# Patient Record
Sex: Female | Born: 1965 | Race: Black or African American | Hispanic: No | Marital: Single | Smoking: Never smoker
Health system: Southern US, Community
[De-identification: ages and names within clinical notes are randomized; demographics above are authoritative.]

## PROBLEM LIST (undated history)

## (undated) DIAGNOSIS — T7840XA Allergy, unspecified, initial encounter: Secondary | ICD-10-CM

## (undated) HISTORY — DX: Allergy, unspecified, initial encounter: T78.40XA

---
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
## 2005-07-15 ENCOUNTER — Other Ambulatory Visit: Admission: RE | Admit: 2005-07-15 | Discharge: 2005-07-15 | Payer: Self-pay | Admitting: Obstetrics and Gynecology

## 2007-10-22 ENCOUNTER — Encounter: Admission: RE | Admit: 2007-10-22 | Discharge: 2007-10-22 | Payer: Self-pay | Admitting: Obstetrics and Gynecology

## 2008-04-19 ENCOUNTER — Encounter: Admission: RE | Admit: 2008-04-19 | Discharge: 2008-04-19 | Payer: Self-pay | Admitting: Obstetrics and Gynecology

## 2008-10-21 ENCOUNTER — Encounter: Admission: RE | Admit: 2008-10-21 | Discharge: 2008-10-21 | Payer: Self-pay | Admitting: Obstetrics and Gynecology

## 2009-07-18 ENCOUNTER — Encounter: Admission: RE | Admit: 2009-07-18 | Discharge: 2009-07-18 | Payer: Self-pay | Admitting: Obstetrics and Gynecology

## 2009-09-07 IMAGING — MG MM DIGITAL DIAGNOSTIC UNILAT*R*
2 series · 2 of 2 positions shown · non-contrast
Comparison: 10-16-07.

DG DIAGNOSTIC UNILATERAL R
CC and MLO view(s) were taken of the right breast.

DIGITAL UNILATERAL RIGHT  DIAGNOSTIC MAMMOGRAM WITH CAD:
CLINICAL DATA: Calcifications seen 10-16-07.

[R CC]
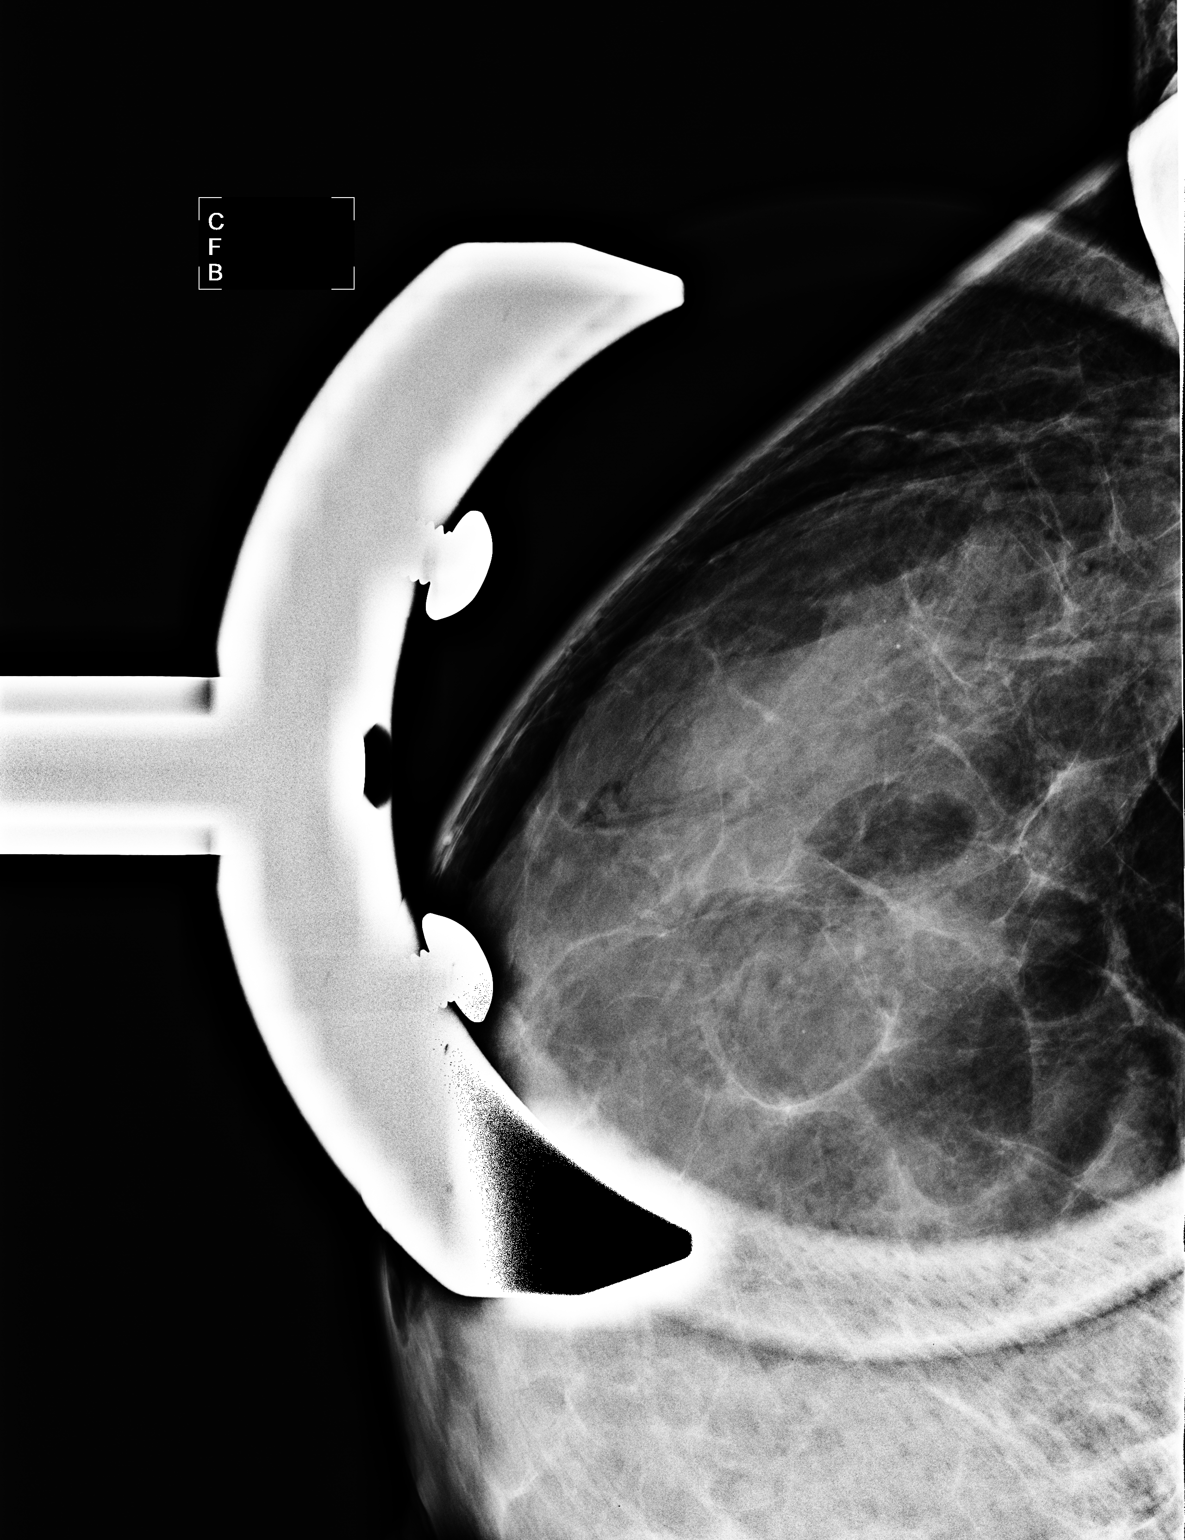

[R ML]
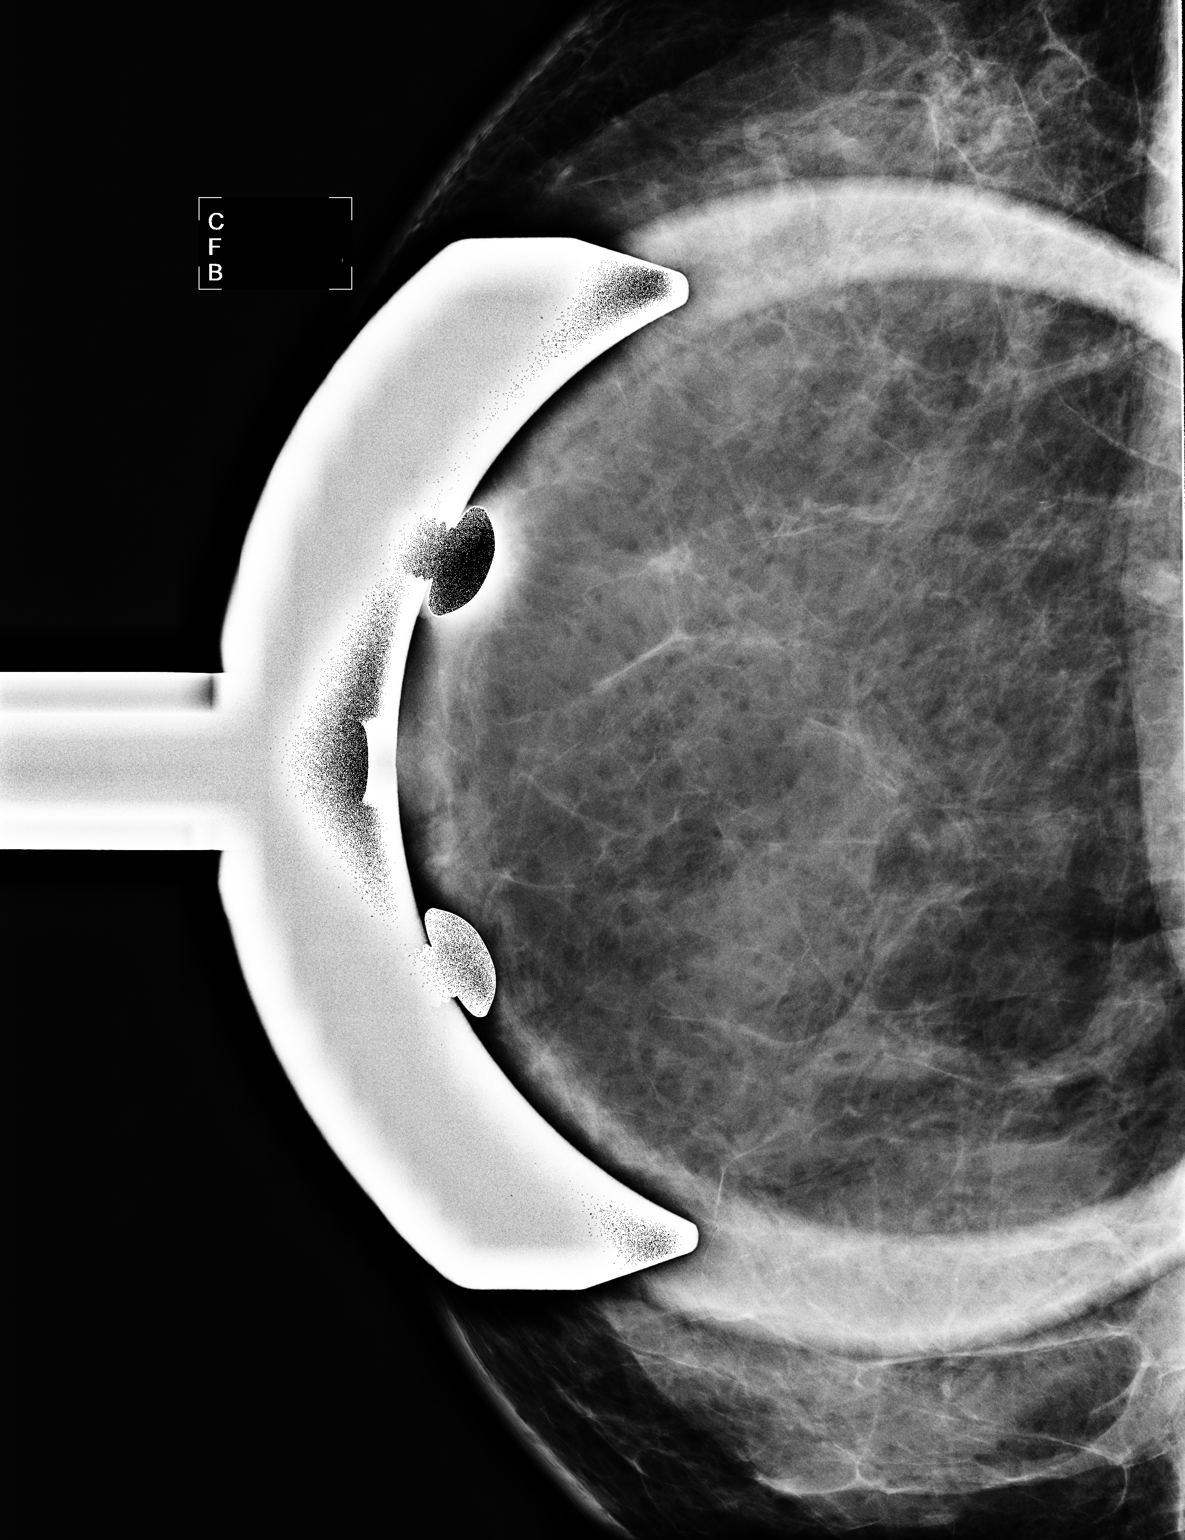

[2 of 2 positions shown; findings below may reference images not displayed]

Magnification views right breast were performed.  There are three or four round calcifications 
right breast posteriorly 9 o'clock position.  On the magnification lateral view there is suggestion
that these adopt a linear configuration suggesting milk of calcium.
IMPRESSION: Probably benign calcifications.  Recommend diagnostic mammogram right breast in six months.  It is 
perceived that some of these calcifications were likely previously present.

ASSESSMENT: Probably benign - BI-RADS 3

Diagnostic mammogram of the right breast in 6 months.
ANALYZED BY COMPUTER AIDED DETECTION. , THIS PROCEDURE WAS A DIGITAL MAMMOGRAM.

## 2016-02-15 ENCOUNTER — Other Ambulatory Visit: Payer: Self-pay | Admitting: Obstetrics and Gynecology

## 2016-02-15 DIAGNOSIS — Z803 Family history of malignant neoplasm of breast: Secondary | ICD-10-CM

## 2016-02-23 ENCOUNTER — Ambulatory Visit
Admission: RE | Admit: 2016-02-23 | Discharge: 2016-02-23 | Disposition: A | Discharge status: Home / Self Care | Payer: BC Managed Care – PPO | Source: Ambulatory Visit | Attending: Obstetrics and Gynecology | Admitting: Obstetrics and Gynecology

## 2016-02-23 DIAGNOSIS — Z803 Family history of malignant neoplasm of breast: Secondary | ICD-10-CM

## 2016-02-23 MED ORDER — GADOBENATE DIMEGLUMINE 529 MG/ML IV SOLN
12.0000 mL | Freq: Once | INTRAVENOUS | Status: AC | PRN
  Administered 2016-02-23: 12 mL via INTRAVENOUS

## 2016-02-27 ENCOUNTER — Other Ambulatory Visit: Payer: Self-pay | Admitting: Obstetrics and Gynecology

## 2016-02-27 ENCOUNTER — Other Ambulatory Visit: Payer: Self-pay

## 2016-02-27 DIAGNOSIS — R928 Other abnormal and inconclusive findings on diagnostic imaging of breast: Secondary | ICD-10-CM

## 2016-03-04 ENCOUNTER — Ambulatory Visit
Admission: RE | Admit: 2016-03-04 | Discharge: 2016-03-04 | Disposition: A | Discharge status: Home / Self Care | Payer: BC Managed Care – PPO | Source: Ambulatory Visit | Attending: Obstetrics and Gynecology | Admitting: Obstetrics and Gynecology

## 2016-03-04 ENCOUNTER — Other Ambulatory Visit: Payer: Self-pay | Admitting: Obstetrics and Gynecology

## 2016-03-04 DIAGNOSIS — R928 Other abnormal and inconclusive findings on diagnostic imaging of breast: Secondary | ICD-10-CM

## 2016-03-04 MED ORDER — GADOBENATE DIMEGLUMINE 529 MG/ML IV SOLN: 12.0000 mL | Freq: Once | INTRAVENOUS | Status: DC | PRN

## 2016-08-07 ENCOUNTER — Other Ambulatory Visit: Payer: Self-pay | Admitting: Obstetrics and Gynecology

## 2016-08-07 DIAGNOSIS — Z09 Encounter for follow-up examination after completed treatment for conditions other than malignant neoplasm: Secondary | ICD-10-CM

## 2016-08-08 ENCOUNTER — Other Ambulatory Visit: Payer: Self-pay | Admitting: Obstetrics and Gynecology

## 2016-08-08 DIAGNOSIS — N6320 Unspecified lump in the left breast, unspecified quadrant: Secondary | ICD-10-CM

## 2016-09-09 ENCOUNTER — Inpatient Hospital Stay: Admission: RE | Admit: 2016-09-09 | Payer: BC Managed Care – PPO | Source: Ambulatory Visit

## 2016-09-17 ENCOUNTER — Ambulatory Visit
Admission: RE | Admit: 2016-09-17 | Discharge: 2016-09-17 | Disposition: A | Discharge status: Home / Self Care | Payer: BC Managed Care – PPO | Source: Ambulatory Visit | Attending: Obstetrics and Gynecology | Admitting: Obstetrics and Gynecology

## 2016-09-17 DIAGNOSIS — N6320 Unspecified lump in the left breast, unspecified quadrant: Secondary | ICD-10-CM

## 2016-09-17 MED ORDER — GADOBENATE DIMEGLUMINE 529 MG/ML IV SOLN
10.0000 mL | Freq: Once | INTRAVENOUS | Status: AC | PRN
  Administered 2016-09-17: 10 mL via INTRAVENOUS

## 2020-04-13 ENCOUNTER — Other Ambulatory Visit: Payer: Self-pay | Admitting: Obstetrics and Gynecology

## 2020-04-13 DIAGNOSIS — R928 Other abnormal and inconclusive findings on diagnostic imaging of breast: Secondary | ICD-10-CM

## 2020-04-27 ENCOUNTER — Ambulatory Visit
Admission: RE | Admit: 2020-04-27 | Discharge: 2020-04-27 | Disposition: A | Discharge status: Home / Self Care | Payer: BC Managed Care – PPO | Source: Ambulatory Visit | Attending: Obstetrics and Gynecology | Admitting: Obstetrics and Gynecology

## 2020-04-27 ENCOUNTER — Other Ambulatory Visit: Payer: Self-pay

## 2020-04-27 ENCOUNTER — Ambulatory Visit: Payer: BC Managed Care – PPO

## 2020-04-27 DIAGNOSIS — R928 Other abnormal and inconclusive findings on diagnostic imaging of breast: Secondary | ICD-10-CM

## 2020-10-23 ENCOUNTER — Encounter: Payer: Self-pay | Admitting: Gastroenterology

## 2020-12-01 ENCOUNTER — Other Ambulatory Visit: Payer: Self-pay

## 2020-12-01 ENCOUNTER — Ambulatory Visit (AMBULATORY_SURGERY_CENTER): Payer: Self-pay

## 2020-12-01 VITALS — Ht 62.5 in | Wt 154.2 lb

## 2020-12-01 DIAGNOSIS — Z1211 Encounter for screening for malignant neoplasm of colon: Secondary | ICD-10-CM

## 2020-12-01 NOTE — Progress Notes (Signed)

## 2020-12-14 ENCOUNTER — Encounter: Payer: Self-pay | Admitting: Gastroenterology

## 2020-12-15 ENCOUNTER — Encounter: Payer: Self-pay | Admitting: Gastroenterology

## 2020-12-15 ENCOUNTER — Other Ambulatory Visit: Payer: Self-pay

## 2020-12-15 ENCOUNTER — Ambulatory Visit (AMBULATORY_SURGERY_CENTER): Payer: BC Managed Care – PPO | Admitting: Gastroenterology

## 2020-12-15 VITALS — BP 125/70 | HR 67 | Temp 97.7°F | Resp 25 | Ht 62.5 in | Wt 154.2 lb

## 2020-12-15 DIAGNOSIS — D120 Benign neoplasm of cecum: Secondary | ICD-10-CM

## 2020-12-15 DIAGNOSIS — Z1211 Encounter for screening for malignant neoplasm of colon: Secondary | ICD-10-CM | POA: Diagnosis present

## 2020-12-15 DIAGNOSIS — D122 Benign neoplasm of ascending colon: Secondary | ICD-10-CM

## 2020-12-15 MED ORDER — SODIUM CHLORIDE 0.9 % IV SOLN: 500.0000 mL | Freq: Once | INTRAVENOUS | Status: DC

## 2020-12-15 NOTE — Patient Instructions (Signed)
YOU HAD AN ENDOSCOPIC PROCEDURE TODAY AT THE [facility]:   Refer to the procedure report that was given to you for any specific questions about what was found during the examination.  If the procedure report does not answer your questions, please call your gastroenterologist to clarify.  If you requested that your care partner not be given the details of your procedure findings, then the procedure report has been included in a sealed envelope for you to review at your convenience later.  YOU SHOULD EXPECT: Some feelings of bloating in the abdomen. Passage of more gas than usual.  Walking can help get rid of the air that was put into your GI tract during the procedure and reduce the bloating. If you had a lower endoscopy (such as a colonoscopy or flexible sigmoidoscopy) you may notice spotting of blood in your stool or on the toilet paper. If you underwent a bowel prep for your procedure, you may not have a normal bowel movement for a few days.  Please Note:  You might notice some irritation and congestion in your nose or some drainage.  This is from the oxygen used during your procedure.  There is no need for concern and it should clear up in a day or so.  SYMPTOMS TO REPORT IMMEDIATELY:   Following lower endoscopy (colonoscopy or flexible sigmoidoscopy):  Excessive amounts of blood in the stool  Significant tenderness or worsening of abdominal pains  Swelling of the abdomen that is new, acute  Fever of 100F or higher   Following upper endoscopy (EGD)  Vomiting of blood or coffee ground material  New chest pain or pain under the shoulder blades  Painful or persistently difficult swallowing  New shortness of breath  Fever of 100F or higher  Black, tarry-looking stools  For urgent or emergent issues, a gastroenterologist can be reached at any hour by calling [phone]. Do not use MyChart messaging for urgent concerns.    DIET:  We do recommend a small meal at first, but  then you may proceed to your regular diet.  Drink plenty of fluids but you should avoid alcoholic beverages for 24 hours.  ACTIVITY:  You should plan to take it easy for the rest of today and you should NOT DRIVE or use heavy machinery until tomorrow (because of the sedation medicines used during the test).    FOLLOW UP: Our staff will call the number listed on your records 48-72 hours following your procedure to check on you and address any questions or concerns that you may have regarding the information given to you following your procedure. If we do not reach you, we will leave a message.  We will attempt to reach you two times.  During this call, we will ask if you have developed any symptoms of COVID 19. If you develop any symptoms (ie: fever, flu-like symptoms, shortness of breath, cough etc.) before then, please call [phone].  If you test positive for Covid 19 in the 2 weeks post procedure, please call and report this information to us.    If any biopsies were taken you will be contacted by phone or by letter within the next 1-3 weeks.  Please call us at [phone] if you have not heard about the biopsies in 3 weeks.    SIGNATURES/CONFIDENTIALITY: You and/or your care partner have signed paperwork which will be entered into your electronic medical record.  These signatures attest to the fact that that the information above on   your After Visit Summary has been reviewed and is understood.  Full responsibility of the confidentiality of this discharge information lies with you and/or your care-partner. 

## 2020-12-15 NOTE — Op Note (Signed)
[facility] Patient Name: Lauren Tucker Procedure Date: 12/15/2020 10:08 AM MRN: [id number] Endoscopist: Jackquline Denmark , MD Age: 55 Referring MD:  Date of Birth: 12-17-1965 Gender: Female Account #: [id number] Procedure:                Colonoscopy Indications:              Screening for colorectal malignant neoplasm Medicines:                Monitored Anesthesia Care Procedure:                Pre-Anesthesia Assessment:                           - Prior to the procedure, a History and Physical                            was performed, and patient medications and                            allergies were reviewed. The patient's tolerance of                            previous anesthesia was also reviewed. The risks                            and benefits of the procedure and the sedation                            options and risks were discussed with the patient.                            All questions were answered, and informed consent                            was obtained. Prior Anticoagulants: The patient has                            taken no previous anticoagulant or antiplatelet                            agents. ASA Grade Assessment: I - A normal, healthy                            patient. After reviewing the risks and benefits,                            the patient was deemed in satisfactory condition to                            undergo the procedure.                           After obtaining informed consent, the colonoscope  was passed under direct vision. Throughout the                            procedure, the patient's blood pressure, pulse, and                            oxygen saturations were monitored continuously. The                            Olympus PCF-H190DL ([id number]) Colonoscope was                            introduced through the anus and advanced to the 2                            cm into the ileum. The colonoscopy  was performed                            without difficulty. The patient tolerated the                            procedure well. The quality of the bowel                            preparation was good. The terminal ileum, ileocecal                            valve, appendiceal orifice, and rectum were                            photographed. Scope In: 10:14:34 AM Scope Out: 10:30:16 AM Scope Withdrawal Time: 0 hours 11 minutes 10 seconds  Total Procedure Duration: 0 hours 15 minutes 42 seconds  Findings:                 Two sessile polyps were found in the proximal                            ascending colon and cecum. The polyps were 2 mm in                            size. These polyps were removed with a cold biopsy                            forceps. Resection and retrieval were complete.                           Non-bleeding internal hemorrhoids were found during                            retroflexion. The hemorrhoids were small.                           The terminal ileum appeared normal.  The exam was otherwise without abnormality on                            direct and retroflexion views. Complications:            No immediate complications. Estimated Blood Loss:     Estimated blood loss: none. Impression:               - Diminutive colonic polyps s/p polypectomy.                           - Small internal hemorrhoids.                           - The examined portion of the ileum was normal.                           - The examination was otherwise normal on direct                            and retroflexion views. Recommendation:           - Patient has a contact number available for                            emergencies. The signs and symptoms of potential                            delayed complications were discussed with the                            patient. Return to normal activities tomorrow.                            Written discharge  instructions were provided to the                            patient.                           - Resume previous diet.                           - Continue present medications.                           - Await pathology results.                           - Repeat colonoscopy for surveillance based on                            pathology results.                           - The findings and recommendations were discussed  with the patient's family. Jackquline Denmark, MD 12/15/2020 10:34:42 AM This report has been signed electronically.

## 2020-12-15 NOTE — Progress Notes (Signed)
Pt's states no medical or surgical changes since previsit or office visit. 

## 2020-12-15 NOTE — Progress Notes (Signed)
Called to room to assist during endoscopic procedure.  Patient ID and intended procedure confirmed with present staff. Received instructions for my participation in the procedure from the performing physician.  

## 2020-12-15 NOTE — Progress Notes (Signed)
pt tolerated well. VSS. awake and to recovery. Report given to RN.  

## 2020-12-19 ENCOUNTER — Telehealth: Payer: Self-pay

## 2020-12-19 NOTE — Telephone Encounter (Signed)
  Follow up Call-  Call back number 12/15/2020  Post procedure Call Back phone  # [phone]  Permission to leave phone message Yes  Some recent data might be hidden     Patient questions:  Do you have a fever, pain , or abdominal swelling? No. Pain Score  0 *  Have you tolerated food without any problems? Yes.    Have you been able to return to your normal activities? Yes.    Do you have any questions about your discharge instructions: Diet   No. Medications  No. Follow up visit  No.  Do you have questions or concerns about your Care? No.  Actions: * If pain score is 4 or above: No action needed, pain <4.  1. Have you developed a fever since your procedure? no  2.   Have you had an respiratory symptoms (SOB or cough) since your procedure? no  3.   Have you tested positive for COVID 19 since your procedure no  4.   Have you had any family members/close contacts diagnosed with the COVID 19 since your procedure?  no   If yes to any of these questions please route to Joylene John, RN and Joella Prince, RN

## 2021-01-03 ENCOUNTER — Encounter: Payer: Self-pay | Admitting: Gastroenterology

## 2021-05-14 ENCOUNTER — Other Ambulatory Visit: Payer: Self-pay | Admitting: Obstetrics and Gynecology

## 2021-05-14 DIAGNOSIS — R928 Other abnormal and inconclusive findings on diagnostic imaging of breast: Secondary | ICD-10-CM

## 2021-05-25 ENCOUNTER — Ambulatory Visit
Admission: RE | Admit: 2021-05-25 | Discharge: 2021-05-25 | Disposition: A | Discharge status: Home / Self Care | Payer: BC Managed Care – PPO | Source: Ambulatory Visit | Attending: Obstetrics and Gynecology | Admitting: Obstetrics and Gynecology

## 2021-05-25 ENCOUNTER — Ambulatory Visit: Payer: BC Managed Care – PPO

## 2021-05-25 ENCOUNTER — Other Ambulatory Visit: Payer: Self-pay

## 2021-05-25 DIAGNOSIS — R928 Other abnormal and inconclusive findings on diagnostic imaging of breast: Secondary | ICD-10-CM

## 2022-05-14 ENCOUNTER — Other Ambulatory Visit: Payer: Self-pay | Admitting: Obstetrics and Gynecology

## 2022-05-14 DIAGNOSIS — Z803 Family history of malignant neoplasm of breast: Secondary | ICD-10-CM

## 2023-04-11 IMAGING — MG MM DIGITAL DIAGNOSTIC UNILAT*L* W/ TOMO W/ CAD
6 series · 6 of 18 positions shown · non-contrast
Comparison: Previous exam(s).

CLINICAL DATA: Recall from screening mammography, possible subtle
architectural distortion in the outer breast at middle to posterior
depth visible only on the CC view.

EXAM:
DIGITAL DIAGNOSTIC UNILATERAL LEFT MAMMOGRAM WITH TOMOSYNTHESIS AND
CAD
TECHNIQUE: Left digital diagnostic mammography and breast tomosynthesis was
performed. The images were evaluated with computer-aided detection.

[L CC synth-2D (1 of 2)]
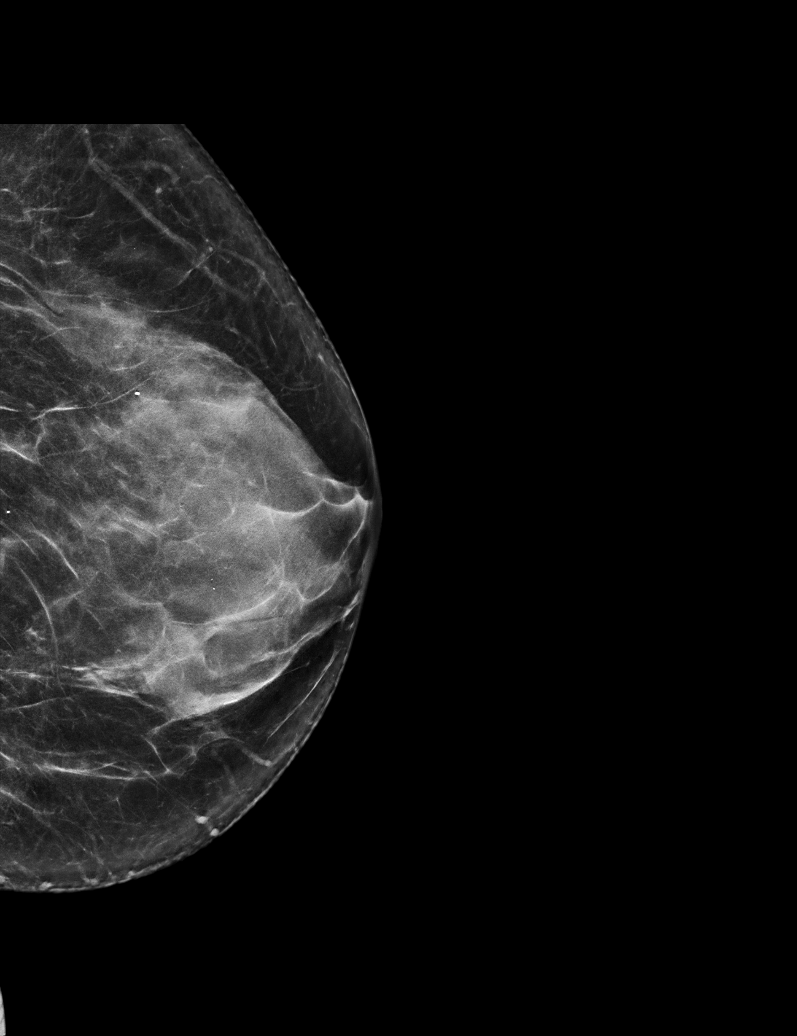

[L ML synth-2D]
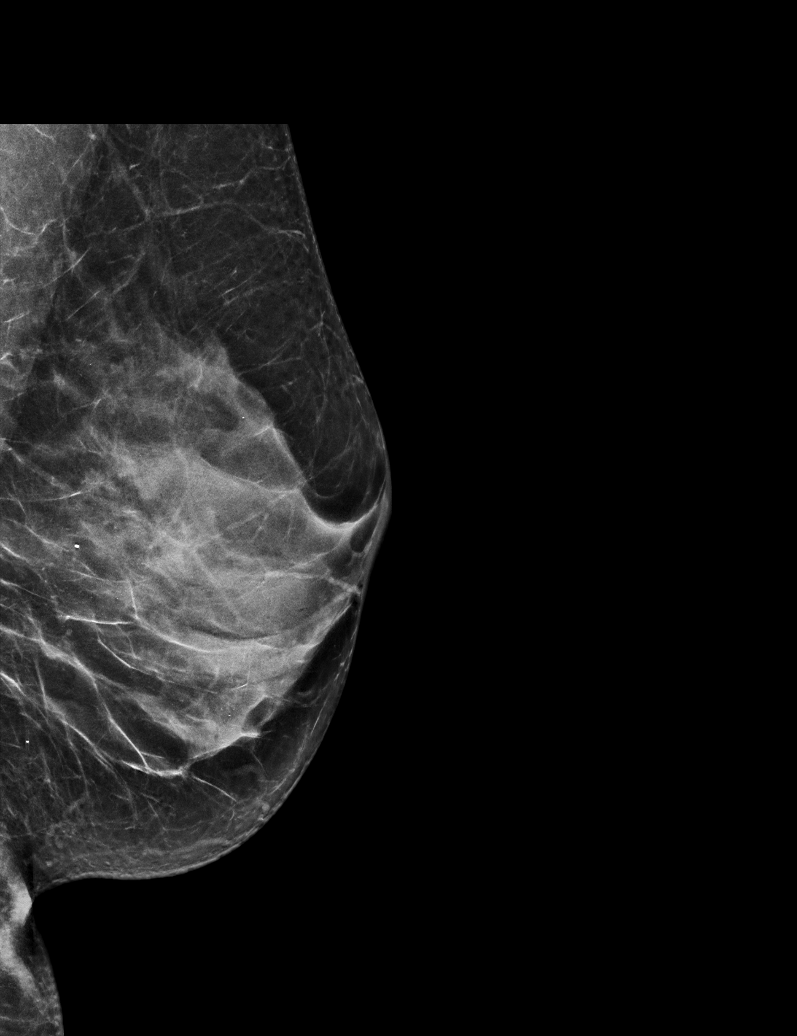

[L CC synth-2D (2 of 2)]
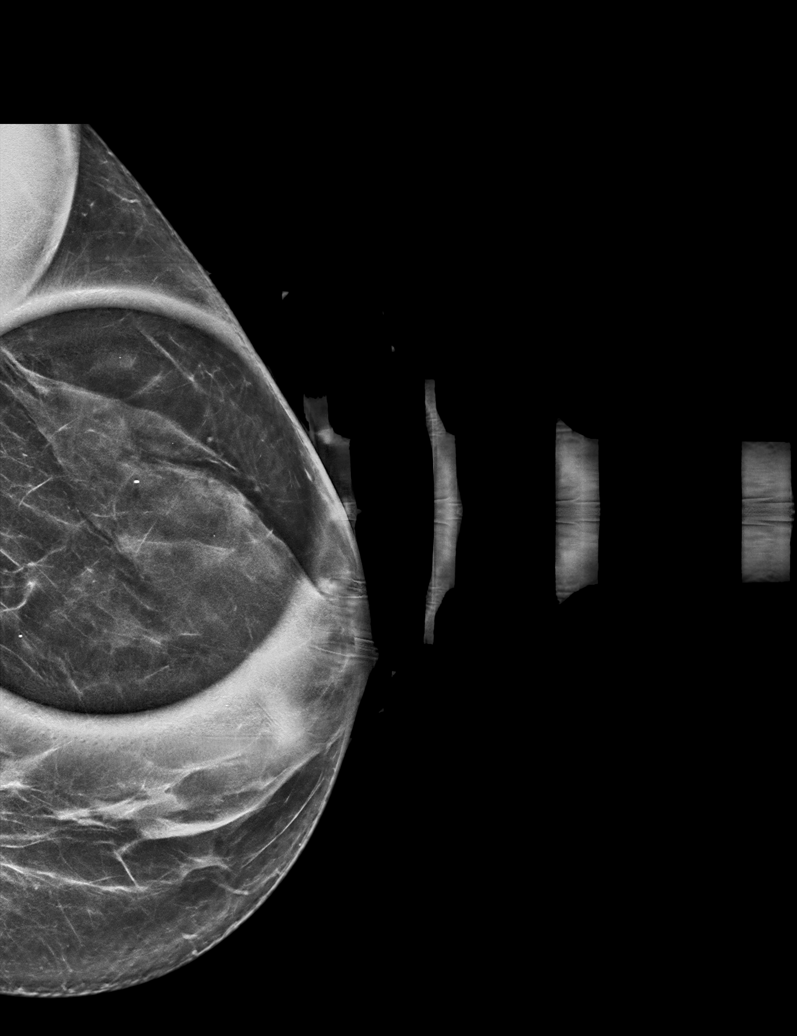

[L CC tomo (1 of 2) · tomo slice 32/63.0]
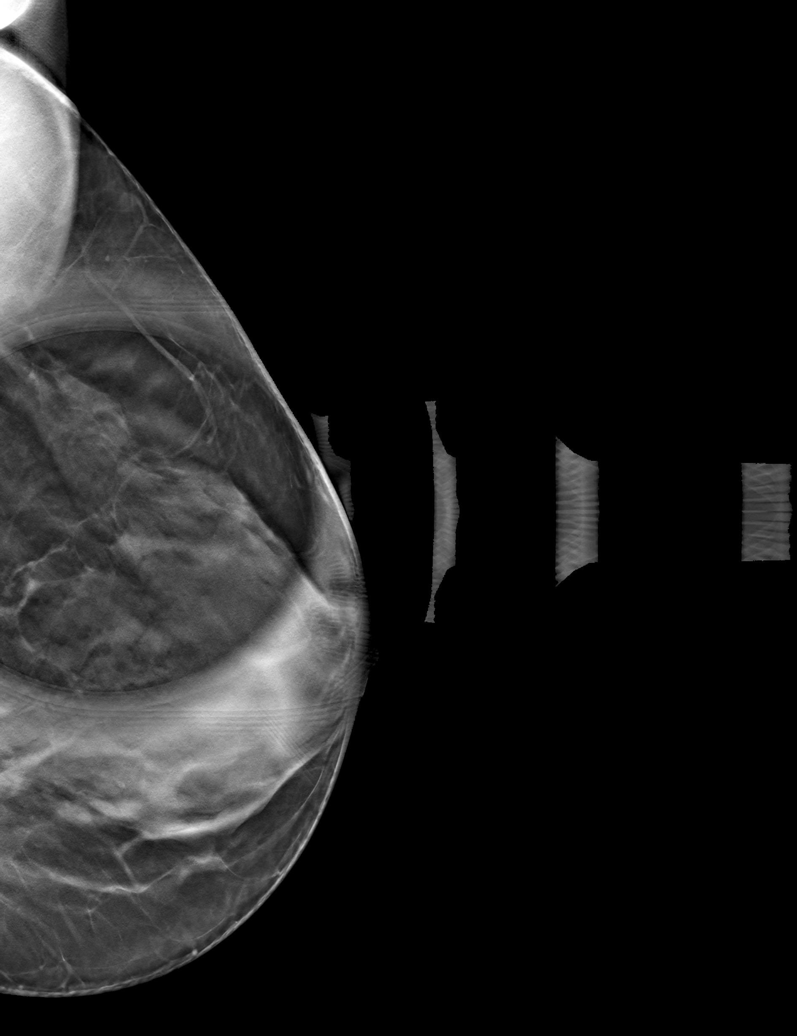

[L ML tomo · tomo slice 31/61.0]
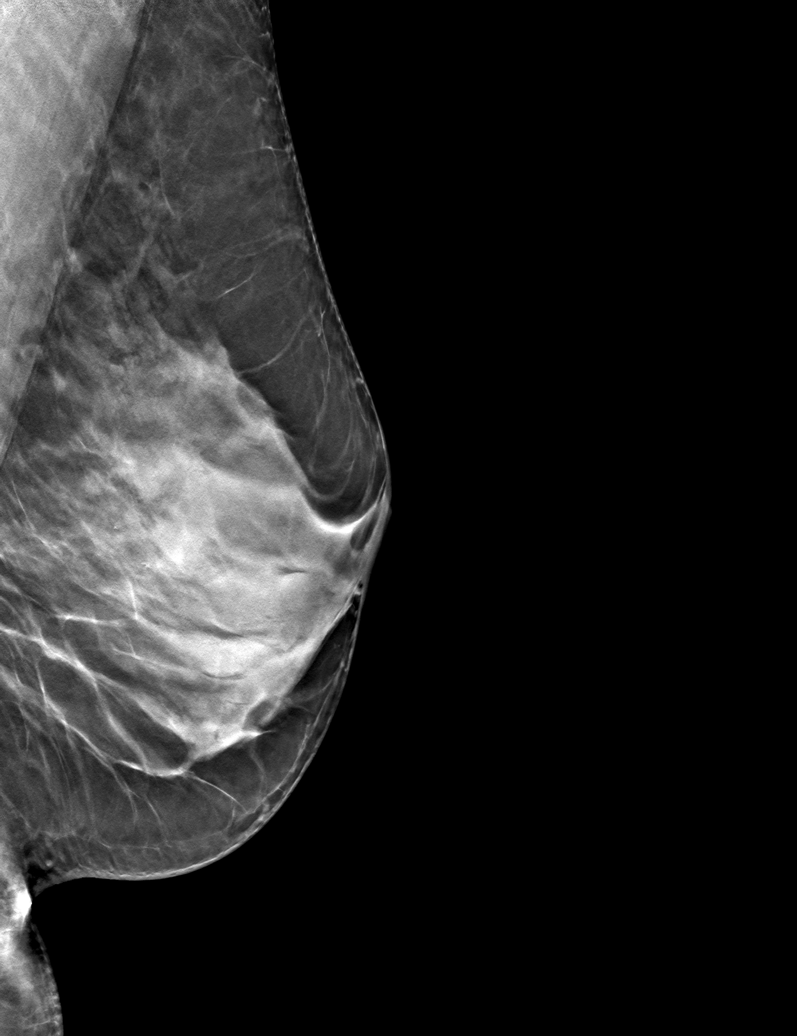

[L CC tomo (2 of 2) · tomo slice 33/66.0]
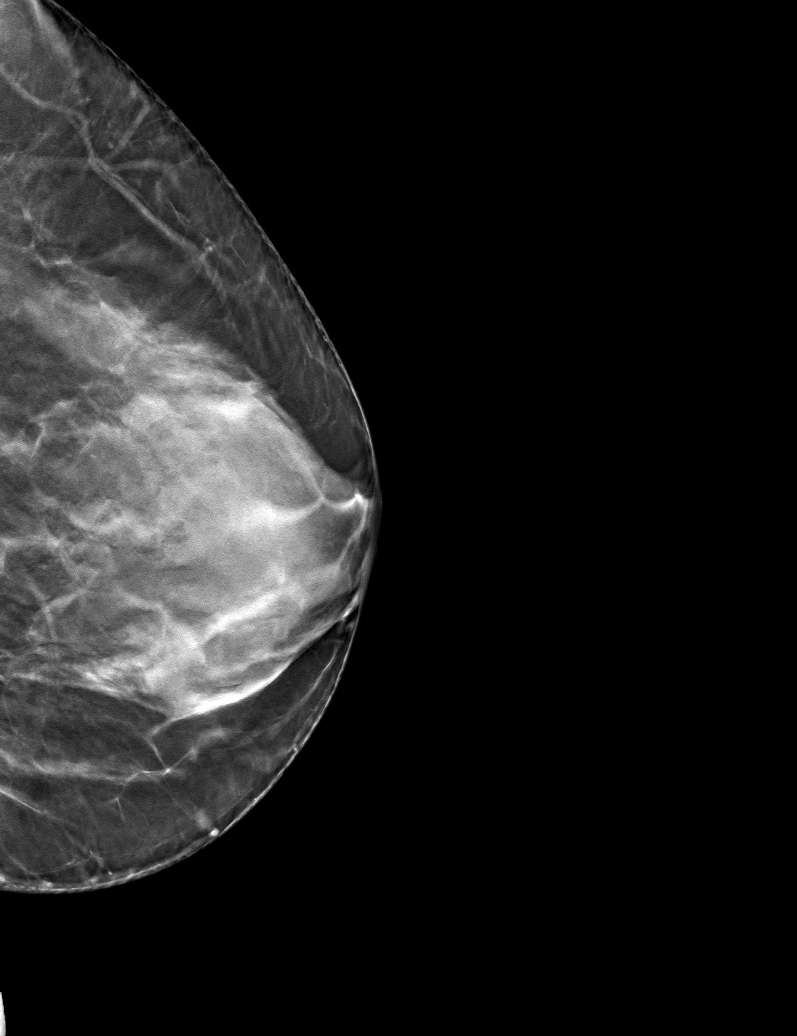

[6 of 18 positions shown; findings below may reference images not displayed]

ACR Breast Density Category d: The breast tissue is extremely dense,
which lowers the sensitivity of mammography.
FINDINGS: Spot-compression CC view of the area of concern, a full field
mediolateral view and a full field laterally rolled CC view were
obtained.

There is no persistent architectural distortion in the outer breast
as questioned on screening mammography. The screening mammographic
finding is due to overlapping normal dense fibroglandular tissue and
Cooper's ligaments.

No findings suspicious for malignancy.
IMPRESSION: No mammographic evidence of malignancy involving the LEFT breast.

RECOMMENDATION:
Screening mammogram in one year.(Code:97-J-63V)

I have discussed the findings and recommendations with the patient.
If applicable, a reminder letter will be sent to the patient
regarding the next appointment.

BI-RADS CATEGORY  1: Negative.

## 2024-08-03 ENCOUNTER — Ambulatory Visit (HOSPITAL_COMMUNITY): Payer: Self-pay

## 2024-08-03 ENCOUNTER — Ambulatory Visit: Payer: Self-pay

## 2024-08-03 ENCOUNTER — Ambulatory Visit
Admission: RE | Admit: 2024-08-03 | Discharge: 2024-08-03 | Disposition: A | Discharge status: Home / Self Care | Source: Ambulatory Visit | Attending: Family Medicine | Admitting: Family Medicine

## 2024-08-03 ENCOUNTER — Ambulatory Visit
Admission: RE | Admit: 2024-08-03 | Discharge: 2024-08-03 | Disposition: A | Discharge status: Home / Self Care | Attending: Family Medicine | Admitting: Family Medicine

## 2024-08-03 ENCOUNTER — Telehealth: Payer: Self-pay

## 2024-08-03 ENCOUNTER — Encounter: Payer: Self-pay | Admitting: Family Medicine

## 2024-08-03 ENCOUNTER — Ambulatory Visit: Admitting: Family Medicine

## 2024-08-03 VITALS — BP 134/86 | HR 68 | Resp 16 | Ht 62.5 in | Wt 147.0 lb

## 2024-08-03 DIAGNOSIS — M5416 Radiculopathy, lumbar region: Secondary | ICD-10-CM

## 2024-08-03 MED ORDER — PREDNISONE 10 MG (48) PO TBPK: ORAL_TABLET | Freq: Every day | ORAL | 0 refills | Status: AC

## 2024-08-03 NOTE — Progress Notes (Signed)
 "  Established Patient Office Visit  Subjective   Patient ID: Lauren Tucker, female    DOB: Jul 17, 1966  Age: 58 y.o. MRN: [id number]  Chief Complaint  Patient presents with   Establish Care    Hip pain x 4 weeks. Aching Left Hip pain travels down the leg. Has been going to gym but no previous or current injury.    HPI She has had 4 weeks of pain.  The pain is in the middle of her left buttocks and slightly lower than that.  She has radiculopathy in the lateral aspect of her thigh and posterior but travels down to her ankle.  She reports that it is an aching feeling but it does not burn or sting.  She has been taking ibuprofen 800 mg multiple times a day.  Advised that ibuprofen is 800 mg every 8 hours.  Her back and leg hurt so badly she is not able to sleep very well.  It hurts to sit.  Lifting her leg up to get in her jeep is painful.  She has no history of a previous back injury.  She has never had surgery.  She has never had this problem before.  She is not taking any medication for this other than ibuprofen.   She is not taking any prescription medications.  She has never been a smoker.  She does drink alcohol but she does not use drugs.  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      Objective:     BP 134/86   Pulse 68   Resp 16   Ht 5' 2.5 (1.588 m)   Wt 147 lb (66.7 kg)   SpO2 98%   BMI 26.46 kg/m    Physical Exam Vitals and nursing note reviewed.  Constitutional:      Appearance: Normal appearance.  HENT:     Head: Normocephalic and atraumatic.  Eyes:     Conjunctiva/sclera: Conjunctivae normal.  Cardiovascular:     Rate and Rhythm: Normal rate and regular rhythm.  Pulmonary:     Effort: Pulmonary effort is normal.     Breath sounds: Normal breath sounds.  Musculoskeletal:     Right lower leg: No edema.     Left lower leg: No edema.  Skin:    General: Skin is warm and dry.  Neurological:     Mental Status: She is alert and oriented to person, place,  and time.     Comments: Positive leg lift seated bilaterally at 90 degrees.   Psychiatric:        Mood and Affect: Mood normal.        Behavior: Behavior normal.        Thought Content: Thought content normal.        Judgment: Judgment normal.          No results found for any visits on 08/03/24.    The 10-year ASCVD risk score (Arnett DK, et al., 2019) is: 5.6%    Assessment & Plan:  Lumbar radiculopathy Assessment & Plan: Please get lumbar x-rays.  Will try prednisone  taper and see how she does.  Follow-up in 2 weeks.  Plan to discuss physical therapy and gabapentin at that time.  Orders: -     predniSONE ; Take by mouth daily. 12-day taper pack, use as directed for taper  Dispense: 1 tablet; Refill: 0 -     DG Lumbar Spine 2-3 Views; Future     Return in about 2 weeks (  around 08/17/2024).    Draedyn Weidinger K Arish Redner, MD "

## 2024-08-03 NOTE — Telephone Encounter (Signed)
 called and left a message for patient to call to discuss the reason for the appt.

## 2024-08-03 NOTE — Telephone Encounter (Signed)
 FYI Only or Action Required?: FYI only for provider: appointment scheduled on 08/03/2024 at 4:10 PM.  Called Nurse Triage reporting Tailbone Pain and Hip Pain.  Symptoms began several weeks ago.  Interventions attempted: Rest, hydration, or home remedies.  Symptoms are: unchanged.  Triage Disposition: See HCP Within 4 Hours (Or PCP Triage)  Patient/caregiver understands and will follow disposition?: Yes  Copied from CRM #[id number]. Topic: Clinical - Red Word Triage >> Aug 03, 2024 12:41 PM Eva FALCON wrote: Red Word that prompted transfer to Nurse Triage: hip pain, tailbone muscle pain has been hurting for about 4 weeks, it hurts to sit down, lay down, has been trying to see if it will get better. Reason for Disposition  [1] SEVERE pain (e.g., excruciating) AND [2] not improved 2 hours after pain medicine/ice packs  Answer Assessment - Initial Assessment Questions Reports tailbone pain and left hip pain for the past four weeks. Reports pain 9 out of 10. Patient requesting to be scheduled for new patient appointment. Option available today at 4:10 PM which patient approved. Scheduled new patient appointment today.   1. MECHANISM: How did the injury happen?       Patient is unsure if she hurt herself when she was working out 2. ONSET: When did the injury happen? (e.g., minutes, hours ago)      4 weeks ago 3. LOCATION: Where is the injury located?      Left hip into tailbone area 4. SEVERITY: Can you sit? Can you walk?      More difficulty sitting, bending over, laying down. Reports she is able to walk 5. PAIN: Is there pain? If Yes, ask: How bad is the pain?  (Scale 0-10; none, mild, moderate, or severe)     9 out of 10 6. SIZE: For bruises, or swelling, ask: How large is it? (e.g., inches or centimeters)      NA 7. OTHER SYMPTOMS: Do you have any other symptoms? (e.g., numbness, back pain)     NO  Protocols used: Tailbone Injury-A-AH

## 2024-08-07 DIAGNOSIS — M5416 Radiculopathy, lumbar region: Secondary | ICD-10-CM | POA: Insufficient documentation

## 2024-08-07 NOTE — Assessment & Plan Note (Signed)
 Please get lumbar x-rays.  Will try prednisone  taper and see how she does.  Follow-up in 2 weeks.  Plan to discuss physical therapy and gabapentin at that time.

## 2024-08-20 ENCOUNTER — Ambulatory Visit: Payer: Self-pay | Admitting: Family Medicine
